# Patient Record
Sex: Female | Born: 1980 | Race: White | Hispanic: No | Marital: Single | Smoking: Current every day smoker
Health system: Southern US, Community
[De-identification: ages and names within clinical notes are randomized; demographics above are authoritative.]

## PROBLEM LIST (undated history)

## (undated) DIAGNOSIS — N200 Calculus of kidney: Secondary | ICD-10-CM

## (undated) DIAGNOSIS — M549 Dorsalgia, unspecified: Secondary | ICD-10-CM

## (undated) DIAGNOSIS — A419 Sepsis, unspecified organism: Secondary | ICD-10-CM

## (undated) HISTORY — PX: LITHOTRIPSY: SUR834

## (undated) HISTORY — PX: OTHER SURGICAL HISTORY: SHX169

---

## 2009-02-26 ENCOUNTER — Emergency Department (HOSPITAL_COMMUNITY): Admission: EM | Admit: 2009-02-26 | Discharge: 2009-02-26 | Payer: Self-pay | Admitting: Diagnostic Radiology

## 2009-10-24 ENCOUNTER — Emergency Department (HOSPITAL_COMMUNITY): Admission: EM | Admit: 2009-10-24 | Discharge: 2009-10-24 | Payer: Self-pay | Admitting: Emergency Medicine

## 2011-03-01 LAB — URINALYSIS, ROUTINE W REFLEX MICROSCOPIC
Bilirubin Urine: NEGATIVE
Glucose, UA: NEGATIVE mg/dL
Hgb urine dipstick: NEGATIVE
Ketones, ur: NEGATIVE mg/dL
Nitrite: NEGATIVE
Protein, ur: NEGATIVE mg/dL
Specific Gravity, Urine: 1.02 (ref 1.005–1.030)
Urobilinogen, UA: 0.2 mg/dL (ref 0.0–1.0)
pH: 7 (ref 5.0–8.0)

## 2011-03-01 LAB — PREGNANCY, URINE: Preg Test, Ur: NEGATIVE

## 2013-01-24 ENCOUNTER — Emergency Department (HOSPITAL_COMMUNITY): Payer: Self-pay

## 2013-01-24 ENCOUNTER — Encounter (HOSPITAL_COMMUNITY): Payer: Self-pay | Admitting: *Deleted

## 2013-01-24 ENCOUNTER — Emergency Department (HOSPITAL_COMMUNITY)
Admission: EM | Admit: 2013-01-24 | Discharge: 2013-01-24 | Disposition: A | Discharge status: Home / Self Care | Payer: Self-pay | Attending: Emergency Medicine | Admitting: Emergency Medicine

## 2013-01-24 DIAGNOSIS — R300 Dysuria: Secondary | ICD-10-CM | POA: Insufficient documentation

## 2013-01-24 DIAGNOSIS — Z79899 Other long term (current) drug therapy: Secondary | ICD-10-CM | POA: Insufficient documentation

## 2013-01-24 DIAGNOSIS — Z87440 Personal history of urinary (tract) infections: Secondary | ICD-10-CM | POA: Insufficient documentation

## 2013-01-24 DIAGNOSIS — R109 Unspecified abdominal pain: Secondary | ICD-10-CM | POA: Insufficient documentation

## 2013-01-24 DIAGNOSIS — R3915 Urgency of urination: Secondary | ICD-10-CM | POA: Insufficient documentation

## 2013-01-24 DIAGNOSIS — R51 Headache: Secondary | ICD-10-CM | POA: Insufficient documentation

## 2013-01-24 DIAGNOSIS — R110 Nausea: Secondary | ICD-10-CM | POA: Insufficient documentation

## 2013-01-24 DIAGNOSIS — R509 Fever, unspecified: Secondary | ICD-10-CM | POA: Insufficient documentation

## 2013-01-24 DIAGNOSIS — F17200 Nicotine dependence, unspecified, uncomplicated: Secondary | ICD-10-CM | POA: Insufficient documentation

## 2013-01-24 DIAGNOSIS — Z3202 Encounter for pregnancy test, result negative: Secondary | ICD-10-CM | POA: Insufficient documentation

## 2013-01-24 HISTORY — DX: Calculus of kidney: N20.0

## 2013-01-24 HISTORY — DX: Dorsalgia, unspecified: M54.9

## 2013-01-24 LAB — CBC WITH DIFFERENTIAL/PLATELET
Lymphocytes Relative: 47 % — ABNORMAL HIGH (ref 12–46)
Lymphs Abs: 5.5 10*3/uL — ABNORMAL HIGH (ref 0.7–4.0)
Neutrophils Relative %: 42 % — ABNORMAL LOW (ref 43–77)
Platelets: 312 10*3/uL (ref 150–400)
RBC: 4.72 MIL/uL (ref 3.87–5.11)
WBC: 11.7 10*3/uL — ABNORMAL HIGH (ref 4.0–10.5)

## 2013-01-24 LAB — URINE MICROSCOPIC-ADD ON

## 2013-01-24 LAB — BASIC METABOLIC PANEL
CO2: 25 mEq/L (ref 19–32)
GFR calc non Af Amer: 84 mL/min — ABNORMAL LOW (ref >90)
Glucose, Bld: 105 mg/dL — ABNORMAL HIGH (ref 70–99)
Potassium: 3.9 mEq/L (ref 3.5–5.1)
Sodium: 136 mEq/L (ref 135–145)

## 2013-01-24 LAB — URINALYSIS, ROUTINE W REFLEX MICROSCOPIC
Leukocytes, UA: NEGATIVE
Nitrite: NEGATIVE
Specific Gravity, Urine: 1.01 (ref 1.005–1.030)
pH: 6.5 (ref 5.0–8.0)

## 2013-01-24 LAB — PREGNANCY, URINE: Preg Test, Ur: NEGATIVE

## 2013-01-24 MED ORDER — PROMETHAZINE HCL 25 MG PO TABS: 25.0000 mg | ORAL_TABLET | Freq: Four times a day (QID) | ORAL | Status: DC | PRN

## 2013-01-24 MED ORDER — HYDROCODONE-ACETAMINOPHEN 5-325 MG PO TABS: 1.0000 | ORAL_TABLET | Freq: Four times a day (QID) | ORAL | Status: DC | PRN

## 2013-01-24 MED ORDER — ONDANSETRON HCL 4 MG/2ML IJ SOLN
4.0000 mg | Freq: Once | INTRAMUSCULAR | Status: AC
  Administered 2013-01-24: 4 mg via INTRAVENOUS
  Filled 2013-01-24: qty 2

## 2013-01-24 MED ORDER — SODIUM CHLORIDE 0.9 % IV BOLUS (SEPSIS)
1000.0000 mL | Freq: Once | INTRAVENOUS | Status: AC
  Administered 2013-01-24: 1000 mL via INTRAVENOUS

## 2013-01-24 MED ORDER — SULFAMETHOXAZOLE-TRIMETHOPRIM 800-160 MG PO TABS: 1.0000 | ORAL_TABLET | Freq: Two times a day (BID) | ORAL | Status: DC

## 2013-01-24 MED ORDER — SODIUM CHLORIDE 0.9 % IV SOLN: INTRAVENOUS | Status: DC

## 2013-01-24 MED ORDER — HYDROMORPHONE HCL PF 1 MG/ML IJ SOLN
1.0000 mg | Freq: Once | INTRAMUSCULAR | Status: AC
  Administered 2013-01-24: 1 mg via INTRAVENOUS
  Filled 2013-01-24: qty 1

## 2013-01-24 NOTE — ED Provider Notes (Deleted)
History     CSN: [id number]  Arrival date & time 01/24/13  8295   First MD Initiated Contact with Patient 01/24/13 1838      Chief Complaint  Patient presents with  . Flank Pain    (Consider location/radiation/quality/duration/timing/severity/associated sxs/prior treatment) HPI  Past Medical History  Diagnosis Date  . Kidney stones   . Back pain     Past Surgical History  Procedure Laterality Date  . Lithotripsy    . Ureteral stent and removal      History reviewed. No pertinent family history.  History  Substance Use Topics  . Smoking status: Current Every Day Smoker  . Smokeless tobacco: Not on file  . Alcohol Use: No    OB History   Grav Para Term Preterm Abortions TAB SAB Ect Mult Living                  Review of Systems  Allergies  Clindamycin/lincomycin and Multivitamins  Home Medications   Current Outpatient Rx  Name  Route  Sig  Dispense  Refill  . acetaminophen (TYLENOL) 500 MG tablet   Oral   Take 500 mg by mouth once as needed for pain or fever.         Marland Kitchen amitriptyline (ELAVIL) 50 MG tablet   Oral   Take 50 mg by mouth at bedtime.         . cyclobenzaprine (FLEXERIL) 10 MG tablet   Oral   Take 10 mg by mouth 3 (three) times daily as needed for muscle spasms.         Marland Kitchen ibuprofen (ADVIL,MOTRIN) 800 MG tablet   Oral   Take 800 mg by mouth 2 (two) times daily as needed for pain.         . pregabalin (LYRICA) 150 MG capsule   Oral   Take 150 mg by mouth 2 (two) times daily.         Marland Kitchen HYDROcodone-acetaminophen (NORCO/VICODIN) 5-325 MG per tablet   Oral   Take 1-2 tablets by mouth every 6 (six) hours as needed for pain.   15 tablet   0   . promethazine (PHENERGAN) 25 MG tablet   Oral   Take 1 tablet (25 mg total) by mouth every 6 (six) hours as needed for nausea.   12 tablet   0     BP 158/105  Pulse 115  Temp(Src) 97.4 F (36.3 C) (Oral)  Resp 20  Ht 5\' 6"  (1.676 m)  Wt 218 lb (98.884 kg)  BMI 35.2 kg/m2   SpO2 96%  LMP 01/10/2013  Physical Exam  ED Course  Procedures (including critical care time)  Labs Reviewed  URINALYSIS, ROUTINE W REFLEX MICROSCOPIC - Abnormal; Notable for the following:    APPearance HAZY (*)    Hgb urine dipstick MODERATE (*)    All other components within normal limits  CBC WITH DIFFERENTIAL - Abnormal; Notable for the following:    WBC 11.7 (*)    Neutrophils Relative 42 (*)    Lymphocytes Relative 47 (*)    Lymphs Abs 5.5 (*)    All other components within normal limits  BASIC METABOLIC PANEL - Abnormal; Notable for the following:    Glucose, Bld 105 (*)    GFR calc non Af Amer 84 (*)    All other components within normal limits  URINE MICROSCOPIC-ADD ON - Abnormal; Notable for the following:    Squamous Epithelial / LPF FEW (*)    Bacteria,  UA MANY (*)    All other components within normal limits  URINE CULTURE  PREGNANCY, URINE   Ct Abdomen Pelvis Wo Contrast  01/24/2013  *RADIOLOGY REPORT*  Clinical Data: 32 year old female right flank pain.  History of kidney stones.  Back pain.  CT ABDOMEN AND PELVIS WITHOUT CONTRAST  Technique:  Multidetector CT imaging of the abdomen and pelvis was performed following the standard protocol without intravenous contrast.  Comparison: Lumbar radiographs 10/24/2009.  Findings: Minor thickening and nodularity at the right lateral costophrenic angle and along the major fissure appears to be post inflammatory.  Elsewhere the lung bases are negative.  Moderate to severe lower lumbar facet degeneration.  Vacuum disc phenomena at L5-S1.  Mild grade 1 anterolisthesis at L5-S1. No acute osseous abnormality identified.  No pelvic free fluid.  Negative noncontrast uterus and adnexa. Negative distal colon.  Redundant sigmoid.  Negative more proximal colon.  Normal appendix.  Negative distal small bowel except for some flocculated material in the terminal ileum.  No dilated small bowel.  Negative noncontrast stomach, duodenum, liver,  gallbladder, spleen, and adrenal glands.  Hypoplastic tail of the pancreas. Other noncontrast pancreatic parenchyma within normal limits. Small mesenteric lymph nodes, within normal limits.  No left hydronephrosis, nephrolithiasis, perinephric stranding, hydroureter or periureteral stranding.  Small left hemi pelvis phlebolith.  No right hydronephrosis or nephrolithiasis.  Mildly lobulated appearance of the right kidney.  No perinephric stranding or hydroureter.  No periureteral stranding or evidence of right ureteral calculus.  Negative diminutive bladder.  No abdominal free fluid.  IMPRESSION: 1.  No urologic calculus or evidence of obstructive uropathy. 2.  Normal appendix and no inflammatory changes identified in the abdomen pelvis. 3.  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  Chronic grade 1 anterolisthesis L5-S1.   Original Report Authenticated By: Erskine Speed, M.D.      1. Flank pain       MDM  Workup for the right-sided flank pain without any specific findings. CT scan negative for right ureteral calculus. No evidence of appendicitis. Urinalysis not consistent with urinary tract infection. Renal function is normal. There is a leukocytosis. Patient improved with pain medication in the emergency department. Has followup with primary care doctor in [place].        Nichole Jakes, MD 01/24/13 2028

## 2013-01-24 NOTE — ED Notes (Signed)
Rt flank pain, nausea, dysuria,

## 2013-01-24 NOTE — ED Provider Notes (Signed)
History    Scribed for Shelda Jakes, MD, the patient was seen in room APA11/APA11. This chart was scribed by Lewanda Rife, ED scribe. Patient's care was started at 1902.   CSN: [id number]  Arrival date & time 01/24/13  4098   First MD Initiated Contact with Patient 01/24/13 1838      Chief Complaint  Patient presents with  . Flank Pain    (Consider location/radiation/quality/duration/timing/severity/associated sxs/prior treatment) HPI Nichole Clark is a 32 y.o. female who presents to the Emergency Department complaining of waxing and waning non-radiating right sided flank pain onset 1 week. Pt describes the pain 10/10 in severity and sharp. Pt reports nausea, urgency, dysuria, headaches, subjective fever, and chills. Pt denies emesis, hematuria, abdominal pain, diarrhea, shortness of breath, chest pain, neck pain, rhinorrhea, and visual disturbance. Pt reports hx of kidney stones and similar symptoms today.  PCP Dr. Richardson Dopp from [place], [place]   Past Medical History  Diagnosis Date  . Kidney stones   . Back pain     Past Surgical History  Procedure Laterality Date  . Lithotripsy    . Ureteral stent and removal      History reviewed. No pertinent family history.  History  Substance Use Topics  . Smoking status: Current Every Day Smoker  . Smokeless tobacco: Not on file  . Alcohol Use: No    OB History   Grav Para Term Preterm Abortions TAB SAB Ect Mult Living                  Review of Systems  Constitutional: Positive for fever and chills.  HENT: Negative for rhinorrhea and neck pain.   Eyes: Negative for visual disturbance.  Respiratory: Negative for shortness of breath.   Cardiovascular: Negative for chest pain.  Gastrointestinal: Positive for nausea. Negative for vomiting, abdominal pain and diarrhea.  Genitourinary: Positive for dysuria, urgency and flank pain. Negative for hematuria.  Skin: Negative for rash.  Neurological: Positive for headaches.   Hematological: Does not bruise/bleed easily.   A complete 10 system review of systems was obtained and all systems are negative except as noted in the HPI and PMH.    Allergies  Clindamycin/lincomycin and Multivitamins  Home Medications   Current Outpatient Rx  Name  Route  Sig  Dispense  Refill  . acetaminophen (TYLENOL) 500 MG tablet   Oral   Take 500 mg by mouth once as needed for pain or fever.         Marland Kitchen amitriptyline (ELAVIL) 50 MG tablet   Oral   Take 50 mg by mouth at bedtime.         . cyclobenzaprine (FLEXERIL) 10 MG tablet   Oral   Take 10 mg by mouth 3 (three) times daily as needed for muscle spasms.         Marland Kitchen ibuprofen (ADVIL,MOTRIN) 800 MG tablet   Oral   Take 800 mg by mouth 2 (two) times daily as needed for pain.         . pregabalin (LYRICA) 150 MG capsule   Oral   Take 150 mg by mouth 2 (two) times daily.         Marland Kitchen HYDROcodone-acetaminophen (NORCO/VICODIN) 5-325 MG per tablet   Oral   Take 1-2 tablets by mouth every 6 (six) hours as needed for pain.   15 tablet   0   . promethazine (PHENERGAN) 25 MG tablet   Oral   Take 1 tablet (25 mg total)  by mouth every 6 (six) hours as needed for nausea.   12 tablet   0     BP 158/105  Pulse 115  Temp(Src) 97.4 F (36.3 C) (Oral)  Resp 20  Ht 5\' 6"  (1.676 m)  Wt 218 lb (98.884 kg)  BMI 35.2 kg/m2  SpO2 96%  LMP 01/10/2013  Physical Exam  Nursing note and vitals reviewed. Constitutional: She is oriented to person, place, and time. She appears well-developed and well-nourished. No distress.  HENT:  Head: Normocephalic and atraumatic.  Mouth/Throat: Oropharynx is clear and moist.  Eyes: Conjunctivae and EOM are normal. Pupils are equal, round, and reactive to light. No scleral icterus.  Neck: Normal range of motion. Neck supple. No tracheal deviation present.  Cardiovascular: Normal rate, regular rhythm and normal heart sounds.   No murmur heard. Pulmonary/Chest: Effort normal and breath  sounds normal. No respiratory distress. She has no wheezes. She has no rales.  Abdominal: Soft. Bowel sounds are normal. She exhibits no distension. There is no tenderness. There is CVA tenderness (right). There is no rebound and no guarding.  Musculoskeletal: Normal range of motion.  Lymphadenopathy:    She has no cervical adenopathy.  Neurological: She is alert and oriented to person, place, and time.  Skin: Skin is warm and dry.  Psychiatric: She has a normal mood and affect. Her behavior is normal.    ED Course  Procedures (including critical care time) Medications  0.9 %  sodium chloride infusion (not administered)  sodium chloride 0.9 % bolus 1,000 mL (1,000 mLs Intravenous New Bag/Given 01/24/13 1933)  ondansetron (ZOFRAN) injection 4 mg (4 mg Intravenous Given 01/24/13 1932)  HYDROmorphone (DILAUDID) injection 1 mg (1 mg Intravenous Given 01/24/13 1932)   2015: Pt informed of benign abdominal CT and informed of d/c instructions   Labs Reviewed  URINALYSIS, ROUTINE W REFLEX MICROSCOPIC - Abnormal; Notable for the following:    APPearance HAZY (*)    Hgb urine dipstick MODERATE (*)    All other components within normal limits  CBC WITH DIFFERENTIAL - Abnormal; Notable for the following:    WBC 11.7 (*)    Neutrophils Relative 42 (*)    Lymphocytes Relative 47 (*)    Lymphs Abs 5.5 (*)    All other components within normal limits  BASIC METABOLIC PANEL - Abnormal; Notable for the following:    Glucose, Bld 105 (*)    GFR calc non Af Amer 84 (*)    All other components within normal limits  URINE MICROSCOPIC-ADD ON - Abnormal; Notable for the following:    Squamous Epithelial / LPF FEW (*)    Bacteria, UA MANY (*)    All other components within normal limits  URINE CULTURE  PREGNANCY, URINE   Ct Abdomen Pelvis Wo Contrast  01/24/2013  *RADIOLOGY REPORT*  Clinical Data: 32 year old female right flank pain.  History of kidney stones.  Back pain.  CT ABDOMEN AND PELVIS  WITHOUT CONTRAST  Technique:  Multidetector CT imaging of the abdomen and pelvis was performed following the standard protocol without intravenous contrast.  Comparison: Lumbar radiographs 10/24/2009.  Findings: Minor thickening and nodularity at the right lateral costophrenic angle and along the major fissure appears to be post inflammatory.  Elsewhere the lung bases are negative.  Moderate to severe lower lumbar facet degeneration.  Vacuum disc phenomena at L5-S1.  Mild grade 1 anterolisthesis at L5-S1. No acute osseous abnormality identified.  No pelvic free fluid.  Negative noncontrast uterus and adnexa. Negative  distal colon.  Redundant sigmoid.  Negative more proximal colon.  Normal appendix.  Negative distal small bowel except for some flocculated material in the terminal ileum.  No dilated small bowel.  Negative noncontrast stomach, duodenum, liver, gallbladder, spleen, and adrenal glands.  Hypoplastic tail of the pancreas. Other noncontrast pancreatic parenchyma within normal limits. Small mesenteric lymph nodes, within normal limits.  No left hydronephrosis, nephrolithiasis, perinephric stranding, hydroureter or periureteral stranding.  Small left hemi pelvis phlebolith.  No right hydronephrosis or nephrolithiasis.  Mildly lobulated appearance of the right kidney.  No perinephric stranding or hydroureter.  No periureteral stranding or evidence of right ureteral calculus.  Negative diminutive bladder.  No abdominal free fluid.  IMPRESSION: 1.  No urologic calculus or evidence of obstructive uropathy. 2.  Normal appendix and no inflammatory changes identified in the abdomen pelvis. 3.  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  Chronic grade 1 anterolisthesis L5-S1.   Original Report Authenticated By: Erskine Speed, M.D.    Results for orders placed during the hospital encounter of 01/24/13  URINALYSIS, ROUTINE W REFLEX MICROSCOPIC      Result Value Range   Color, Urine YELLOW  YELLOW    APPearance HAZY (*) CLEAR   Specific Gravity, Urine 1.010  1.005 - 1.030   pH 6.5  5.0 - 8.0   Glucose, UA NEGATIVE  NEGATIVE mg/dL   Hgb urine dipstick MODERATE (*) NEGATIVE   Bilirubin Urine NEGATIVE  NEGATIVE   Ketones, ur NEGATIVE  NEGATIVE mg/dL   Protein, ur NEGATIVE  NEGATIVE mg/dL   Urobilinogen, UA 0.2  0.0 - 1.0 mg/dL   Nitrite NEGATIVE  NEGATIVE   Leukocytes, UA NEGATIVE  NEGATIVE  PREGNANCY, URINE      Result Value Range   Preg Test, Ur NEGATIVE  NEGATIVE  CBC WITH DIFFERENTIAL      Result Value Range   WBC 11.7 (*) 4.0 - 10.5 K/uL   RBC 4.72  3.87 - 5.11 MIL/uL   Hemoglobin 15.0  12.0 - 15.0 g/dL   HCT 16.1  09.6 - 04.5 %   MCV 91.1  78.0 - 100.0 fL   MCH 31.8  26.0 - 34.0 pg   MCHC 34.9  30.0 - 36.0 g/dL   RDW 40.9  81.1 - 91.4 %   Platelets 312  150 - 400 K/uL   Neutrophils Relative 42 (*) 43 - 77 %   Neutro Abs 4.9  1.7 - 7.7 K/uL   Lymphocytes Relative 47 (*) 12 - 46 %   Lymphs Abs 5.5 (*) 0.7 - 4.0 K/uL   Monocytes Relative 7  3 - 12 %   Monocytes Absolute 0.8  0.1 - 1.0 K/uL   Eosinophils Relative 3  0 - 5 %   Eosinophils Absolute 0.4  0.0 - 0.7 K/uL   Basophils Relative 0  0 - 1 %   Basophils Absolute 0.0  0.0 - 0.1 K/uL  BASIC METABOLIC PANEL      Result Value Range   Sodium 136  135 - 145 mEq/L   Potassium 3.9  3.5 - 5.1 mEq/L   Chloride 101  96 - 112 mEq/L   CO2 25  19 - 32 mEq/L   Glucose, Bld 105 (*) 70 - 99 mg/dL   BUN 10  6 - 23 mg/dL   Creatinine, Ser 7.82  0.50 - 1.10 mg/dL   Calcium 9.7  8.4 - 95.6 mg/dL   GFR calc non Af Amer 84 (*) >90 mL/min  GFR calc Af Amer >90  >90 mL/min  URINE MICROSCOPIC-ADD ON      Result Value Range   Squamous Epithelial / LPF FEW (*) RARE   WBC, UA 3-6  <3 WBC/hpf   RBC / HPF 3-6  <3 RBC/hpf   Bacteria, UA MANY (*) RARE      1. Flank pain       MDM  Workup for a right flank pain without any significant findings. No evidence of a right ureteral stone no evidence of appendicitis. Urinalysis not  consistent with urinary tract infection. Renal function is normal moderate leukocytosis is present patient has primary care Dr. to followup with the [place].     I personally performed the services described in this documentation, which was scribed in my presence. The recorded information has been reviewed and is accurate.     Shelda Jakes, MD 01/24/13 2029

## 2013-01-28 LAB — URINE CULTURE: Colony Count: 45000

## 2013-01-29 NOTE — ED Notes (Signed)
+   Urine Patient treated with Septra-sensitive to same-Chart appended per protocol MD. 

## 2014-07-02 ENCOUNTER — Emergency Department (HOSPITAL_COMMUNITY)
Admission: EM | Admit: 2014-07-02 | Discharge: 2014-07-02 | Disposition: A | Discharge status: Home / Self Care | Payer: Self-pay | Attending: Emergency Medicine | Admitting: Emergency Medicine

## 2014-07-02 ENCOUNTER — Emergency Department (HOSPITAL_COMMUNITY): Payer: Self-pay

## 2014-07-02 ENCOUNTER — Encounter (HOSPITAL_COMMUNITY): Payer: Self-pay | Admitting: Emergency Medicine

## 2014-07-02 DIAGNOSIS — Z9889 Other specified postprocedural states: Secondary | ICD-10-CM | POA: Insufficient documentation

## 2014-07-02 DIAGNOSIS — R197 Diarrhea, unspecified: Secondary | ICD-10-CM | POA: Insufficient documentation

## 2014-07-02 DIAGNOSIS — Z791 Long term (current) use of non-steroidal anti-inflammatories (NSAID): Secondary | ICD-10-CM | POA: Insufficient documentation

## 2014-07-02 DIAGNOSIS — F17200 Nicotine dependence, unspecified, uncomplicated: Secondary | ICD-10-CM | POA: Insufficient documentation

## 2014-07-02 DIAGNOSIS — Z79899 Other long term (current) drug therapy: Secondary | ICD-10-CM | POA: Insufficient documentation

## 2014-07-02 DIAGNOSIS — Z3202 Encounter for pregnancy test, result negative: Secondary | ICD-10-CM | POA: Insufficient documentation

## 2014-07-02 DIAGNOSIS — R112 Nausea with vomiting, unspecified: Secondary | ICD-10-CM | POA: Insufficient documentation

## 2014-07-02 DIAGNOSIS — R109 Unspecified abdominal pain: Secondary | ICD-10-CM | POA: Insufficient documentation

## 2014-07-02 DIAGNOSIS — N200 Calculus of kidney: Secondary | ICD-10-CM | POA: Insufficient documentation

## 2014-07-02 LAB — CBC WITH DIFFERENTIAL/PLATELET
Basophils Absolute: 0 10*3/uL (ref 0.0–0.1)
Basophils Relative: 0 % (ref 0–1)
EOS ABS: 0.2 10*3/uL (ref 0.0–0.7)
EOS PCT: 2 % (ref 0–5)
HCT: 44.8 % (ref 36.0–46.0)
HEMOGLOBIN: 15.6 g/dL — AB (ref 12.0–15.0)
LYMPHS ABS: 4 10*3/uL (ref 0.7–4.0)
Lymphocytes Relative: 38 % (ref 12–46)
MCH: 31.9 pg (ref 26.0–34.0)
MCHC: 34.8 g/dL (ref 30.0–36.0)
MCV: 91.6 fL (ref 78.0–100.0)
MONO ABS: 0.6 10*3/uL (ref 0.1–1.0)
MONOS PCT: 6 % (ref 3–12)
Neutro Abs: 5.6 10*3/uL (ref 1.7–7.7)
Neutrophils Relative %: 54 % (ref 43–77)
Platelets: 350 10*3/uL (ref 150–400)
RBC: 4.89 MIL/uL (ref 3.87–5.11)
RDW: 12.5 % (ref 11.5–15.5)
WBC: 10.4 10*3/uL (ref 4.0–10.5)

## 2014-07-02 LAB — BASIC METABOLIC PANEL
Anion gap: 14 (ref 5–15)
BUN: 6 mg/dL (ref 6–23)
CALCIUM: 9.4 mg/dL (ref 8.4–10.5)
CO2: 22 mEq/L (ref 19–32)
Chloride: 103 mEq/L (ref 96–112)
Creatinine, Ser: 0.73 mg/dL (ref 0.50–1.10)
GFR calc Af Amer: >90 mL/min (ref >90)
GLUCOSE: 89 mg/dL (ref 70–99)
POTASSIUM: 3.8 meq/L (ref 3.7–5.3)
Sodium: 139 mEq/L (ref 137–147)

## 2014-07-02 LAB — URINALYSIS, ROUTINE W REFLEX MICROSCOPIC
BILIRUBIN URINE: NEGATIVE
Glucose, UA: NEGATIVE mg/dL
KETONES UR: NEGATIVE mg/dL
Leukocytes, UA: NEGATIVE
Nitrite: NEGATIVE
PH: 8 (ref 5.0–8.0)
Protein, ur: NEGATIVE mg/dL
SPECIFIC GRAVITY, URINE: 1.01 (ref 1.005–1.030)
UROBILINOGEN UA: 0.2 mg/dL (ref 0.0–1.0)

## 2014-07-02 LAB — GRAM STAIN

## 2014-07-02 LAB — URINE MICROSCOPIC-ADD ON

## 2014-07-02 LAB — PREGNANCY, URINE: Preg Test, Ur: NEGATIVE

## 2014-07-02 MED ORDER — OXYCODONE-ACETAMINOPHEN 5-325 MG PO TABS: 1.0000 | ORAL_TABLET | ORAL | Status: AC | PRN

## 2014-07-02 MED ORDER — ONDANSETRON HCL 4 MG/2ML IJ SOLN
4.0000 mg | Freq: Once | INTRAMUSCULAR | Status: AC
  Administered 2014-07-02: 4 mg via INTRAMUSCULAR
  Filled 2014-07-02: qty 2

## 2014-07-02 MED ORDER — SODIUM CHLORIDE 0.9 % IV BOLUS (SEPSIS)
1000.0000 mL | Freq: Once | INTRAVENOUS | Status: AC
  Administered 2014-07-02: 1000 mL via INTRAVENOUS

## 2014-07-02 MED ORDER — TAMSULOSIN HCL 0.4 MG PO CAPS: 0.4000 mg | ORAL_CAPSULE | Freq: Every day | ORAL | Status: AC

## 2014-07-02 MED ORDER — ONDANSETRON 4 MG PO TBDP: 4.0000 mg | ORAL_TABLET | Freq: Three times a day (TID) | ORAL | Status: AC | PRN

## 2014-07-02 MED ORDER — HYDROMORPHONE HCL PF 1 MG/ML IJ SOLN
1.0000 mg | Freq: Once | INTRAMUSCULAR | Status: AC
Start: 2014-07-02 — End: 2014-07-02
  Administered 2014-07-02: 1 mg via INTRAVENOUS
  Filled 2014-07-02: qty 1

## 2014-07-02 MED ORDER — HYDROMORPHONE HCL PF 1 MG/ML IJ SOLN
1.0000 mg | Freq: Once | INTRAMUSCULAR | Status: AC
  Administered 2014-07-02: 1 mg via INTRAVENOUS
  Filled 2014-07-02: qty 1

## 2014-07-02 NOTE — ED Provider Notes (Addendum)
CSN: [id number]     Arrival date & time 07/02/14  1159 History   First MD Initiated Contact with Patient 07/02/14 1210     Chief Complaint  Patient presents with  . Nephrolithiasis     (Consider location/radiation/quality/duration/timing/severity/associated sxs/prior Treatment) Patient is a 33 y.o. female presenting with abdominal pain.  Abdominal Pain Pain location:  R flank Pain quality: cramping and shooting   Pain radiates to:  Groin Pain severity:  Moderate Onset quality:  Sudden Duration:  3 days Timing:  Constant Progression:  Waxing and waning Chronicity:  Recurrent Context: previous surgery (prior stenting, lithotripsy)   Context: not recent illness, not sick contacts and not trauma   Relieved by:  Nothing Worsened by:  Nothing tried Ineffective treatments:  None tried Associated symptoms: diarrhea, nausea and vomiting   Associated symptoms: no chest pain, no chills, no constipation, no cough, no dysuria, no fever, no shortness of breath, no sore throat, no vaginal bleeding and no vaginal discharge     Past Medical History  Diagnosis Date  . Kidney stones   . Back pain   . Back pain    Past Surgical History  Procedure Laterality Date  . Lithotripsy    . Ureteral stent and removal     No family history on file. History  Substance Use Topics  . Smoking status: Current Every Day Smoker  . Smokeless tobacco: Not on file  . Alcohol Use: No   OB History   Grav Para Term Preterm Abortions TAB SAB Ect Mult Living                 Review of Systems  Constitutional: Negative for fever and chills.  HENT: Negative for congestion, rhinorrhea and sore throat.   Eyes: Negative for photophobia and visual disturbance.  Respiratory: Negative for cough and shortness of breath.   Cardiovascular: Negative for chest pain and leg swelling.  Gastrointestinal: Positive for nausea, vomiting, abdominal pain and diarrhea. Negative for constipation.  Endocrine: Negative for  polyphagia and polyuria.  Genitourinary: Negative for dysuria, flank pain, vaginal bleeding, vaginal discharge and enuresis.  Musculoskeletal: Negative for back pain and gait problem.  Skin: Negative for color change and rash.  Neurological: Negative for dizziness, syncope, light-headedness and numbness.  Hematological: Negative for adenopathy. Does not bruise/bleed easily.  All other systems reviewed and are negative.     Allergies  Clindamycin/lincomycin and Multivitamins  Home Medications   Prior to Admission medications   Medication Sig Start Date End Date Taking? Authorizing Provider  acetaminophen (TYLENOL) 500 MG tablet Take 500 mg by mouth once as needed for pain or fever.   Yes Historical Provider, MD  amitriptyline (ELAVIL) 50 MG tablet Take 50 mg by mouth at bedtime.   Yes Historical Provider, MD  cyclobenzaprine (FLEXERIL) 10 MG tablet Take 10 mg by mouth 3 (three) times daily as needed for muscle spasms.   Yes Historical Provider, MD  ibuprofen (ADVIL,MOTRIN) 800 MG tablet Take 800 mg by mouth 2 (two) times daily as needed for pain.   Yes Historical Provider, MD  pregabalin (LYRICA) 150 MG capsule Take 150 mg by mouth 2 (two) times daily.   Yes Historical Provider, MD  ondansetron (ZOFRAN ODT) 4 MG disintegrating tablet Take 1 tablet (4 mg total) by mouth every 8 (eight) hours as needed for nausea or vomiting. 07/02/14   Mirian MoMatthew Jaylyn Iyer, MD  oxyCODONE-acetaminophen (PERCOCET/ROXICET) 5-325 MG per tablet Take 1 tablet by mouth every 4 (four) hours as needed for severe  pain. 07/02/14   Mirian Mo, MD  tamsulosin (FLOMAX) 0.4 MG CAPS capsule Take 1 capsule (0.4 mg total) by mouth daily. 07/02/14   Mirian Mo, MD   BP 109/72  Pulse 72  Temp(Src) 99.5 F (37.5 C) (Oral)  Resp 20  Ht 5\' 7"  (1.702 m)  Wt 214 lb (97.07 kg)  BMI 33.51 kg/m2  SpO2 97%  LMP 06/18/2014 Physical Exam  Vitals reviewed. Constitutional: She is oriented to person, place, and time. She appears  well-developed and well-nourished.  HENT:  Head: Normocephalic and atraumatic.  Right Ear: External ear normal.  Left Ear: External ear normal.  Eyes: Conjunctivae and EOM are normal. Pupils are equal, round, and reactive to light.  Neck: Normal range of motion. Neck supple.  Cardiovascular: Normal rate, regular rhythm, normal heart sounds and intact distal pulses.   Pulmonary/Chest: Effort normal and breath sounds normal.  Abdominal: Soft. Bowel sounds are normal. There is no tenderness. There is CVA tenderness (R).  Musculoskeletal: Normal range of motion.  Neurological: She is alert and oriented to person, place, and time.  Skin: Skin is warm and dry.    ED Course  Procedures (including critical care time) Labs Review Labs Reviewed  URINALYSIS, ROUTINE W REFLEX MICROSCOPIC - Abnormal; Notable for the following:    Hgb urine dipstick MODERATE (*)    All other components within normal limits  CBC WITH DIFFERENTIAL - Abnormal; Notable for the following:    Hemoglobin 15.6 (*)    All other components within normal limits  URINE MICROSCOPIC-ADD ON - Abnormal; Notable for the following:    Squamous Epithelial / LPF FEW (*)    All other components within normal limits  GRAM STAIN  URINE CULTURE  PREGNANCY, URINE  BASIC METABOLIC PANEL    Imaging Review Ct Abdomen Pelvis Wo Contrast  07/02/2014   CLINICAL DATA:  Right flank pain with history of kidney stones  EXAM: CT ABDOMEN AND PELVIS WITHOUT CONTRAST  TECHNIQUE: Multidetector CT imaging of the abdomen and pelvis was performed following the standard protocol without IV contrast.  COMPARISON:  01/24/2013  FINDINGS: Lung bases clear except for mild right base scarring.  Liver and gallbladder are normal. Spleen is normal. Pancreas is normal. Adrenal glands are normal. Left kidney is normal. Right kidney demonstrates a 3 mm midpole stone. There is no perinephric inflammatory change. There is no ureteral stone or hydronephrosis. There are  no bladder calculi.  Reproductive organs are normal. No adenopathy or ascites. Abdominal aorta is normal.  Stomach, small bowel, large bowel, and appendix are normal. There are no acute musculoskeletal findings. 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  IMPRESSION: No obstructive nephropathy.  3 mm nonobstructing stone right kidney.   Electronically Signed   By: Esperanza Heir M.D.   On: 07/02/2014 16:37     EKG Interpretation None      MDM   Final diagnoses:  Right flank pain  Nephrolithiasis    33 y.o. female  with pertinent PMH of nephrolithiasis presents with recurrent nephrolithiasis symptoms x3 days.  Patient denies fever, however has had nausea and vomiting over the last 3 days as well nausea, vomiting, and diarrhea, which she states are identical to prior nephrolithiasis symptoms. She presents today she states she can no longer keep down food or liquid. She has a history of prior obstructive nephrolithiasis and urosepsis.  On arrival today patient afebrile, not tachycardic, however  taken ibuprofen shortly prior to arrival. Her physical exam as above is consistent with possible nephrolithiasis. Will obtain lab work, CT scan of abdomen.  Pt care to Dr. Judd Lien in stable condition awaiting CT of abd with plan to dc with expulsive therapy if not obstructed.    Prior to Dr. Sandi Carne assumption of care, CT returned with 3mm nonobstructing stone.  DC home in stable condition with standard expulsive therapy and return precautions.    Labs and imaging as above reviewed.   1. Right flank pain   2. Nephrolithiasis         Mirian Mo, MD 07/02/14 1626  Mirian Mo, MD 07/02/14 [phone]

## 2014-07-02 NOTE — ED Notes (Signed)
Right flank pain x 3 days and pain across lower back. Pt states hx of kidney stones with same pain as before. Also states vomiting. Last vomited 6 hours ago.

## 2014-07-02 NOTE — Discharge Instructions (Signed)

## 2014-07-03 LAB — URINE CULTURE: Colony Count: 8000

## 2015-08-08 ENCOUNTER — Encounter (HOSPITAL_COMMUNITY): Payer: Self-pay | Admitting: Emergency Medicine

## 2015-08-08 ENCOUNTER — Emergency Department (HOSPITAL_COMMUNITY)
Admission: EM | Admit: 2015-08-08 | Discharge: 2015-08-08 | Disposition: A | Discharge status: Home / Self Care | Payer: Self-pay | Attending: Emergency Medicine | Admitting: Emergency Medicine

## 2015-08-08 DIAGNOSIS — R109 Unspecified abdominal pain: Secondary | ICD-10-CM | POA: Insufficient documentation

## 2015-08-08 DIAGNOSIS — Z720 Tobacco use: Secondary | ICD-10-CM | POA: Insufficient documentation

## 2015-08-08 HISTORY — DX: Sepsis, unspecified organism: A41.9

## 2015-08-08 LAB — URINE MICROSCOPIC-ADD ON

## 2015-08-08 LAB — URINALYSIS, ROUTINE W REFLEX MICROSCOPIC
BILIRUBIN URINE: NEGATIVE
Glucose, UA: NEGATIVE mg/dL
Ketones, ur: NEGATIVE mg/dL
Leukocytes, UA: NEGATIVE
Nitrite: NEGATIVE
PH: 7.5 (ref 5.0–8.0)
Protein, ur: NEGATIVE mg/dL
Specific Gravity, Urine: 1.015 (ref 1.005–1.030)
UROBILINOGEN UA: 0.2 mg/dL (ref 0.0–1.0)

## 2015-08-08 NOTE — ED Notes (Signed)
Pt reports was seen for same last week and diagnosed with three kidney stones. Pt reports "96mm,2mm,4mm" stones. Pt reports 4mm is obstructing right kidney. Pt reports unable to follow up with urologist at this time. Pt reports n/v, last void 5 hours ago.

## 2015-08-08 NOTE — ED Notes (Signed)
Pt LWBS after triage per registration.

## 2016-03-24 IMAGING — CT CT ABD-PELV W/O CM
2 of 4 series · 16 of 46 positions shown, 18 images · non-contrast
Comparison: 01/24/2013

CLINICAL DATA: Right flank pain with history of kidney stones

EXAM:
CT ABDOMEN AND PELVIS WITHOUT CONTRAST
TECHNIQUE: Multidetector CT imaging of the abdomen and pelvis was performed
following the standard protocol without IV contrast.

[Series 2: standard/full over (age)lbs 5.0 · axial · 0.76mm/px · z∈[-548,-63]mm · 13 of 107 slices shown, 15 images]
[im 5/107  soft-tissue]
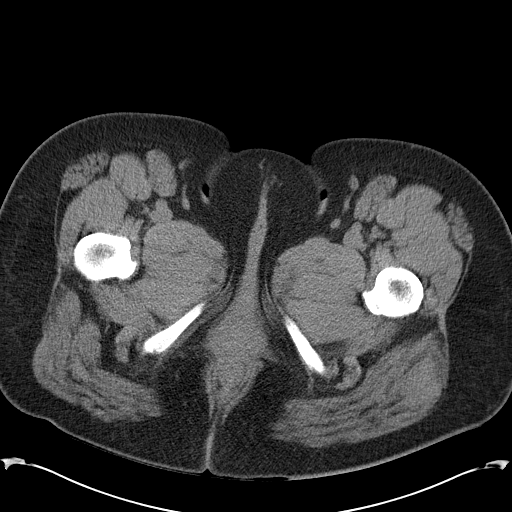
[im 5/107  bone]
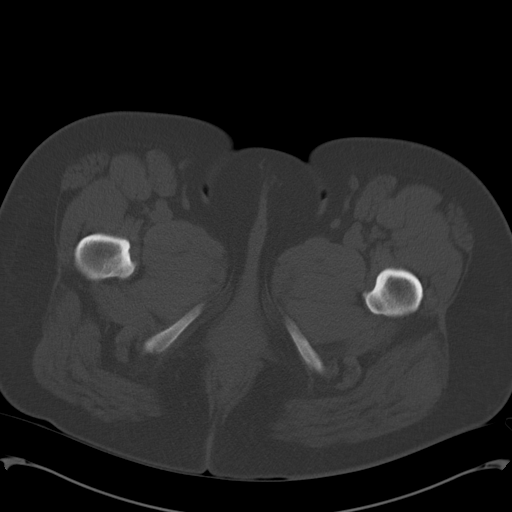
[im 15/107  soft-tissue]
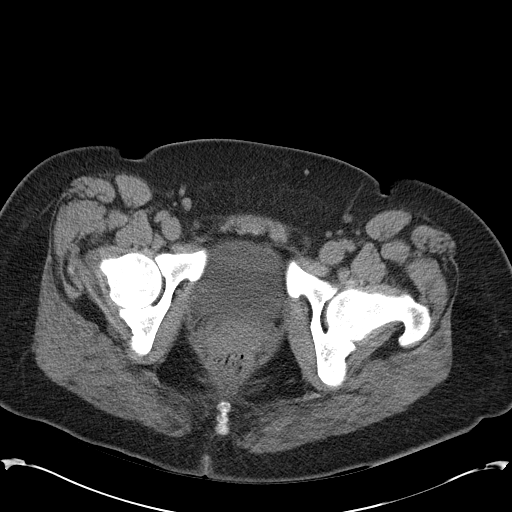
[im 25/107  soft-tissue]
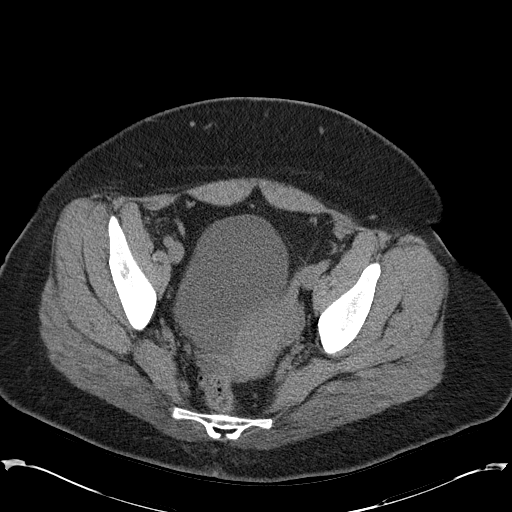
[im 29/107  soft-tissue]
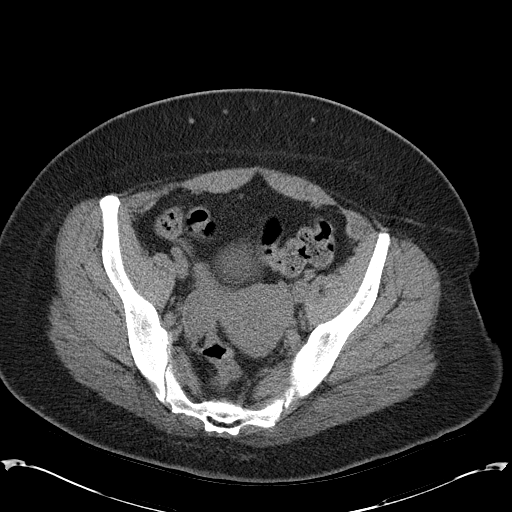
[im 39/107  soft-tissue]
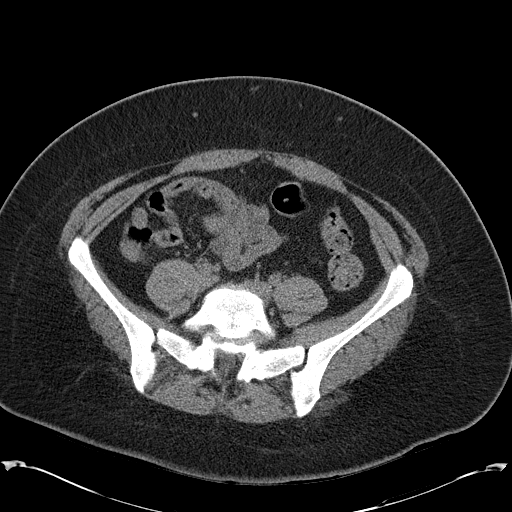
[im 44/107  soft-tissue]
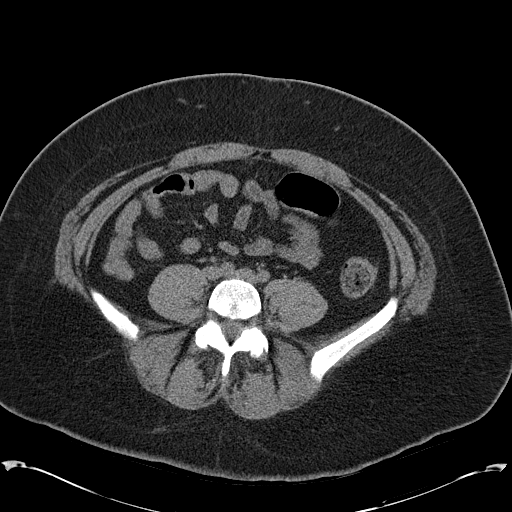
[im 54/107  soft-tissue]
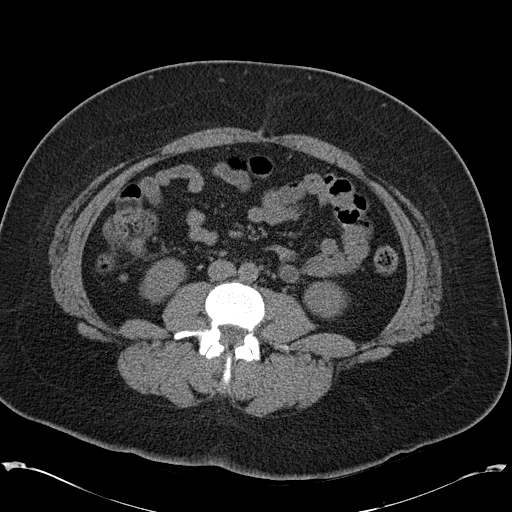
[im 63/107  soft-tissue]
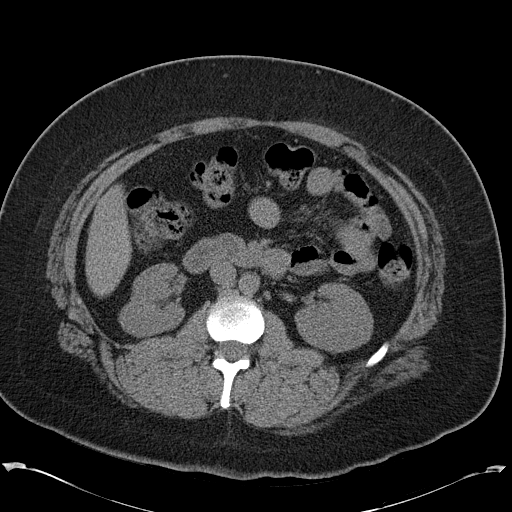
[im 68/107  soft-tissue]
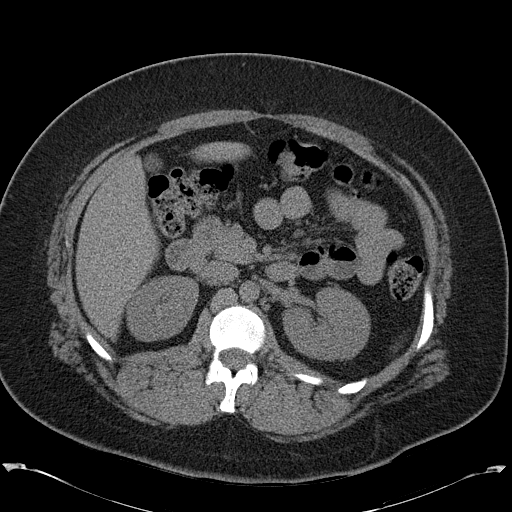
[im 68/107  bone]
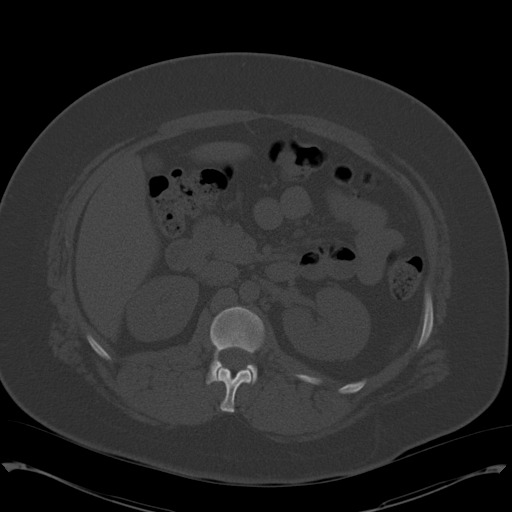
[im 78/107  soft-tissue]
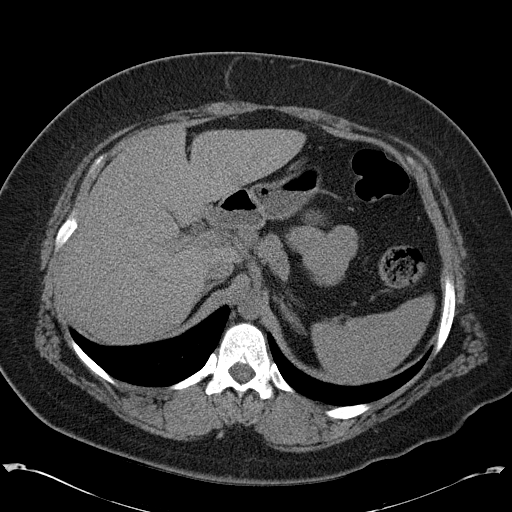
[im 82/107  soft-tissue]
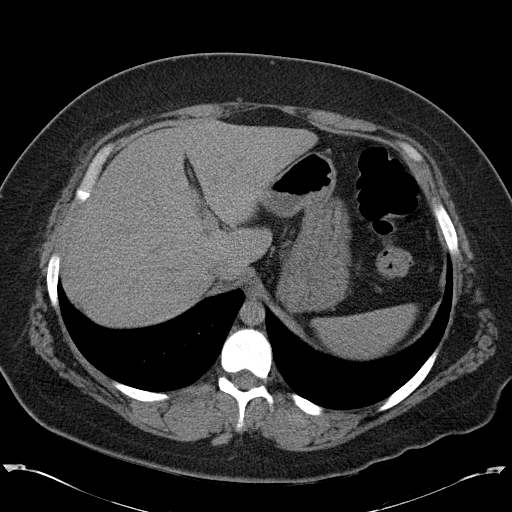
[im 92/107  soft-tissue]
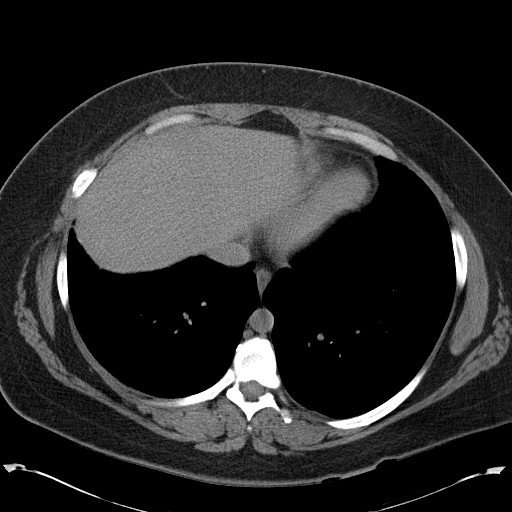
[im 102/107  soft-tissue]
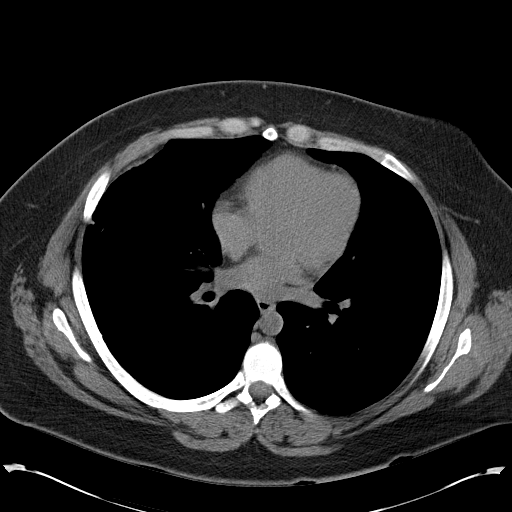

[Series 3: mpr coronal · coronal · 0.75mm/px · 3 of 104 slices shown]
[im 35/104  soft-tissue]
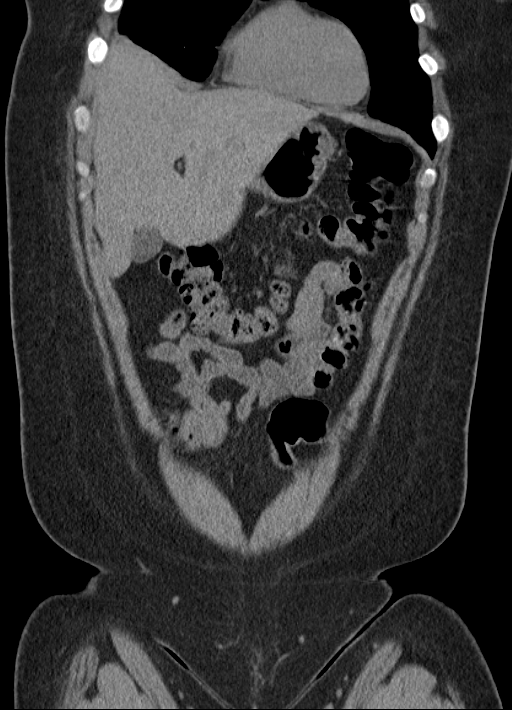
[im 46/104  soft-tissue]
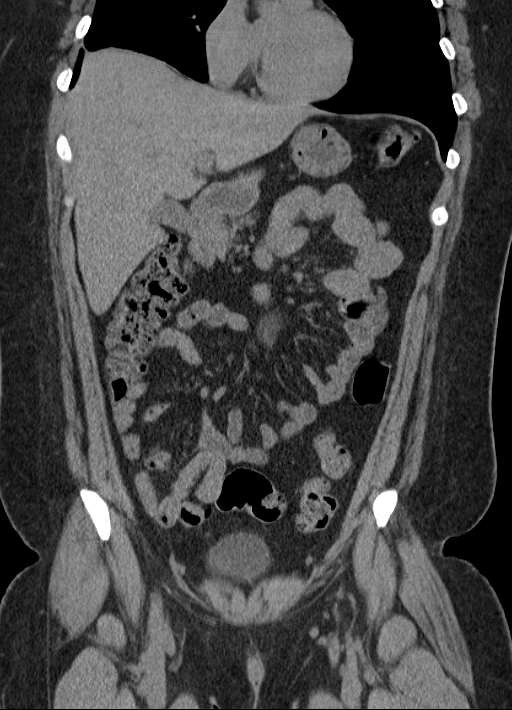
[im 58/104  soft-tissue]
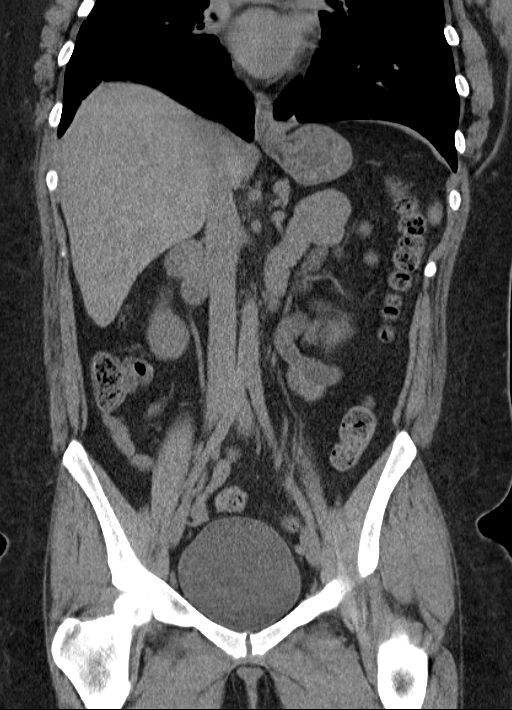

[16 of 46 positions shown; findings below may reference images not displayed]

FINDINGS: Lung bases clear except for mild right base scarring.

Liver and gallbladder are normal. Spleen is normal. Pancreas is
normal. Adrenal glands are normal. Left kidney is normal. Right
kidney demonstrates a 3 mm midpole stone. There is no perinephric
inflammatory change. There is no ureteral stone or hydronephrosis.
There are no bladder calculi.

Reproductive organs are normal. No adenopathy or ascites. Abdominal
aorta is normal.

Stomach, small bowel, large bowel, and appendix are normal. There
are no acute musculoskeletal findings. There are bilateral L5-S1
chronic appearing pars defects with grade 1 anterior listhesis of L5
on the sacrum as a result.
IMPRESSION: No obstructive nephropathy.  3 mm nonobstructing stone right kidney.
